# Patient Record
Sex: Female | Born: 1940
Health system: Southern US, Community
[De-identification: ages and names within clinical notes are randomized; demographics above are authoritative.]

## PROBLEM LIST (undated history)

## (undated) DIAGNOSIS — I10 Essential (primary) hypertension: Secondary | ICD-10-CM

## (undated) DIAGNOSIS — E7800 Pure hypercholesterolemia, unspecified: Secondary | ICD-10-CM

---

## 1998-01-07 ENCOUNTER — Other Ambulatory Visit: Admission: RE | Admit: 1998-01-07 | Discharge: 1998-01-07 | Payer: Self-pay | Admitting: *Deleted

## 1998-01-29 ENCOUNTER — Ambulatory Visit (HOSPITAL_COMMUNITY): Admission: RE | Admit: 1998-01-29 | Discharge: 1998-01-29 | Payer: Self-pay | Admitting: *Deleted

## 1999-01-11 ENCOUNTER — Other Ambulatory Visit: Admission: RE | Admit: 1999-01-11 | Discharge: 1999-01-11 | Payer: Self-pay | Admitting: *Deleted

## 1999-02-01 ENCOUNTER — Encounter: Payer: Self-pay | Admitting: *Deleted

## 1999-02-01 ENCOUNTER — Ambulatory Visit (HOSPITAL_COMMUNITY): Admission: RE | Admit: 1999-02-01 | Discharge: 1999-02-01 | Payer: Self-pay | Admitting: *Deleted

## 1999-12-23 ENCOUNTER — Other Ambulatory Visit: Admission: RE | Admit: 1999-12-23 | Discharge: 1999-12-23 | Payer: Self-pay | Admitting: *Deleted

## 2000-02-16 ENCOUNTER — Encounter: Payer: Self-pay | Admitting: *Deleted

## 2000-02-16 ENCOUNTER — Ambulatory Visit (HOSPITAL_COMMUNITY): Admission: RE | Admit: 2000-02-16 | Discharge: 2000-02-16 | Payer: Self-pay | Admitting: *Deleted

## 2001-02-21 ENCOUNTER — Ambulatory Visit (HOSPITAL_COMMUNITY): Admission: RE | Admit: 2001-02-21 | Discharge: 2001-02-21 | Payer: Self-pay | Admitting: *Deleted

## 2001-02-21 ENCOUNTER — Encounter: Payer: Self-pay | Admitting: *Deleted

## 2001-11-12 ENCOUNTER — Other Ambulatory Visit: Admission: RE | Admit: 2001-11-12 | Discharge: 2001-11-12 | Payer: Self-pay | Admitting: *Deleted

## 2002-11-26 ENCOUNTER — Other Ambulatory Visit: Admission: RE | Admit: 2002-11-26 | Discharge: 2002-11-26 | Payer: Self-pay | Admitting: *Deleted

## 2003-01-15 ENCOUNTER — Encounter: Payer: Self-pay | Admitting: *Deleted

## 2003-01-15 ENCOUNTER — Ambulatory Visit (HOSPITAL_COMMUNITY): Admission: RE | Admit: 2003-01-15 | Discharge: 2003-01-15 | Payer: Self-pay | Admitting: *Deleted

## 2004-01-28 ENCOUNTER — Ambulatory Visit (HOSPITAL_COMMUNITY): Admission: RE | Admit: 2004-01-28 | Discharge: 2004-01-28 | Payer: Self-pay | Admitting: Obstetrics

## 2005-02-09 ENCOUNTER — Ambulatory Visit (HOSPITAL_COMMUNITY): Admission: RE | Admit: 2005-02-09 | Discharge: 2005-02-09 | Payer: Self-pay | Admitting: Obstetrics

## 2005-02-22 ENCOUNTER — Encounter: Admission: RE | Admit: 2005-02-22 | Discharge: 2005-02-22 | Payer: Self-pay | Admitting: Obstetrics

## 2006-02-15 ENCOUNTER — Ambulatory Visit (HOSPITAL_COMMUNITY): Admission: RE | Admit: 2006-02-15 | Discharge: 2006-02-15 | Payer: Self-pay | Admitting: Obstetrics

## 2006-02-23 ENCOUNTER — Encounter: Admission: RE | Admit: 2006-02-23 | Discharge: 2006-02-23 | Payer: Self-pay | Admitting: Obstetrics

## 2006-08-16 ENCOUNTER — Encounter: Admission: RE | Admit: 2006-08-16 | Discharge: 2006-08-16 | Payer: Self-pay | Admitting: Obstetrics

## 2007-02-21 ENCOUNTER — Encounter: Admission: RE | Admit: 2007-02-21 | Discharge: 2007-02-21 | Payer: Self-pay | Admitting: Obstetrics

## 2007-09-10 IMAGING — US UNKNOWN US STUDY
1 series · 1 of 1 positions shown · non-contrast
Comparison: none

[REDACTED] LEFT
CC and MLO view(s) were taken of the left breast.

LEFT BREAST ULTRASOUND
DIGITAL LIMITED LEFT DIAGNOSTIC MAMMOGRAM AND LEFT BREAST ULTRASOUND:
CLINICAL DATA: 65-year-old returns for evaluation of the left breast.

[Series 1: unknown us study · 1 of 1 slices shown]
[im 1/1]
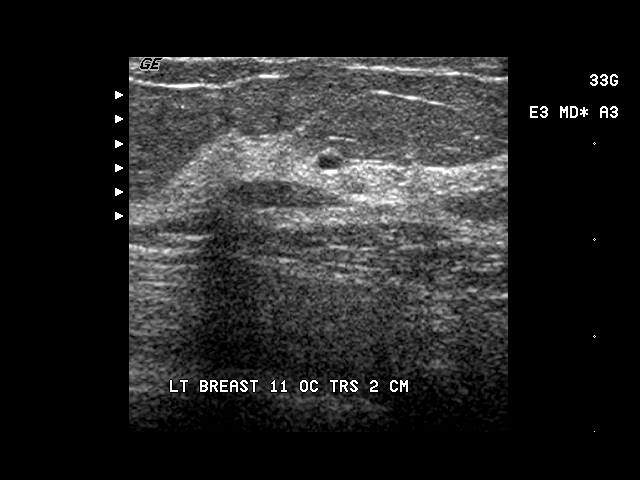

[1 of 1 positions shown; findings below may reference images not displayed]

Spot and magnified views are performed of the upper inner quadrant of the left breast.  There is a 
well-circumscribed, benign-appearing nodule in this portion of the breast.  This is associated with
rare benign-appearing calcifications.  The appearance of the nodule favors small intramammary 
lymph node.

On physical exam, I do not palpate an abnormality in the upper inner quadrant of the left breast.  
Ultrasound is performed showing normal-appearing fibroglandular parenchyma in the upper inner 
quadrant.  A small cyst is seen within the same quadrant but closer to the nipple.
IMPRESSION: Benign-appearing nodule and calcifications in the upper inner quadrant of the left breast.  I would
suggest a follow-up left mammogram in six months to assess stability.

ASSESSMENT: Probably benign - BI-RADS 3

Diagnostic mammogram of the left breast in 6 months.
,

## 2008-03-05 ENCOUNTER — Encounter: Admission: RE | Admit: 2008-03-05 | Discharge: 2008-03-05 | Payer: Self-pay | Admitting: Obstetrics

## 2009-03-11 ENCOUNTER — Encounter: Admission: RE | Admit: 2009-03-11 | Discharge: 2009-03-11 | Payer: Self-pay | Admitting: Obstetrics

## 2010-03-17 ENCOUNTER — Encounter: Admission: RE | Admit: 2010-03-17 | Discharge: 2010-03-17 | Payer: Self-pay | Admitting: Obstetrics

## 2010-06-05 ENCOUNTER — Encounter: Payer: Self-pay | Admitting: Obstetrics

## 2011-02-17 ENCOUNTER — Other Ambulatory Visit: Payer: Self-pay | Admitting: Obstetrics

## 2011-02-17 DIAGNOSIS — Z1231 Encounter for screening mammogram for malignant neoplasm of breast: Secondary | ICD-10-CM

## 2011-03-30 ENCOUNTER — Ambulatory Visit: Payer: Self-pay

## 2011-04-13 ENCOUNTER — Ambulatory Visit
Admission: RE | Admit: 2011-04-13 | Discharge: 2011-04-13 | Disposition: A | Discharge status: Home / Self Care | Payer: Medicare Other | Source: Ambulatory Visit | Attending: Obstetrics | Admitting: Obstetrics

## 2011-04-13 DIAGNOSIS — Z1231 Encounter for screening mammogram for malignant neoplasm of breast: Secondary | ICD-10-CM

## 2012-03-12 ENCOUNTER — Other Ambulatory Visit: Payer: Self-pay | Admitting: Obstetrics

## 2012-03-12 DIAGNOSIS — Z1231 Encounter for screening mammogram for malignant neoplasm of breast: Secondary | ICD-10-CM

## 2012-04-25 ENCOUNTER — Ambulatory Visit
Admission: RE | Admit: 2012-04-25 | Discharge: 2012-04-25 | Disposition: A | Discharge status: Home / Self Care | Payer: Medicare Other | Source: Ambulatory Visit | Attending: Obstetrics | Admitting: Obstetrics

## 2012-04-25 DIAGNOSIS — Z1231 Encounter for screening mammogram for malignant neoplasm of breast: Secondary | ICD-10-CM

## 2012-11-29 ENCOUNTER — Ambulatory Visit
Admission: RE | Admit: 2012-11-29 | Discharge: 2012-11-29 | Disposition: A | Discharge status: Home / Self Care | Payer: Medicare Other | Source: Ambulatory Visit | Attending: Family Medicine | Admitting: Family Medicine

## 2012-11-29 ENCOUNTER — Other Ambulatory Visit: Payer: Self-pay | Admitting: Family Medicine

## 2012-11-29 DIAGNOSIS — M79641 Pain in right hand: Secondary | ICD-10-CM

## 2013-04-16 ENCOUNTER — Other Ambulatory Visit: Payer: Self-pay

## 2013-04-16 DIAGNOSIS — Z1231 Encounter for screening mammogram for malignant neoplasm of breast: Secondary | ICD-10-CM

## 2013-05-22 ENCOUNTER — Ambulatory Visit
Admission: RE | Admit: 2013-05-22 | Discharge: 2013-05-22 | Disposition: A | Discharge status: Home / Self Care | Payer: Medicare Other | Source: Ambulatory Visit

## 2013-05-22 DIAGNOSIS — Z1231 Encounter for screening mammogram for malignant neoplasm of breast: Secondary | ICD-10-CM

## 2013-11-11 IMAGING — MG MM DIGITAL SCREENING BILAT
4 series · 4 of 4 positions shown · non-contrast
Comparison: Previous exams.

CLINICAL DATA: Screening.

DIGITAL BILATERAL SCREENING MAMMOGRAM WITH CAD

[R CC]
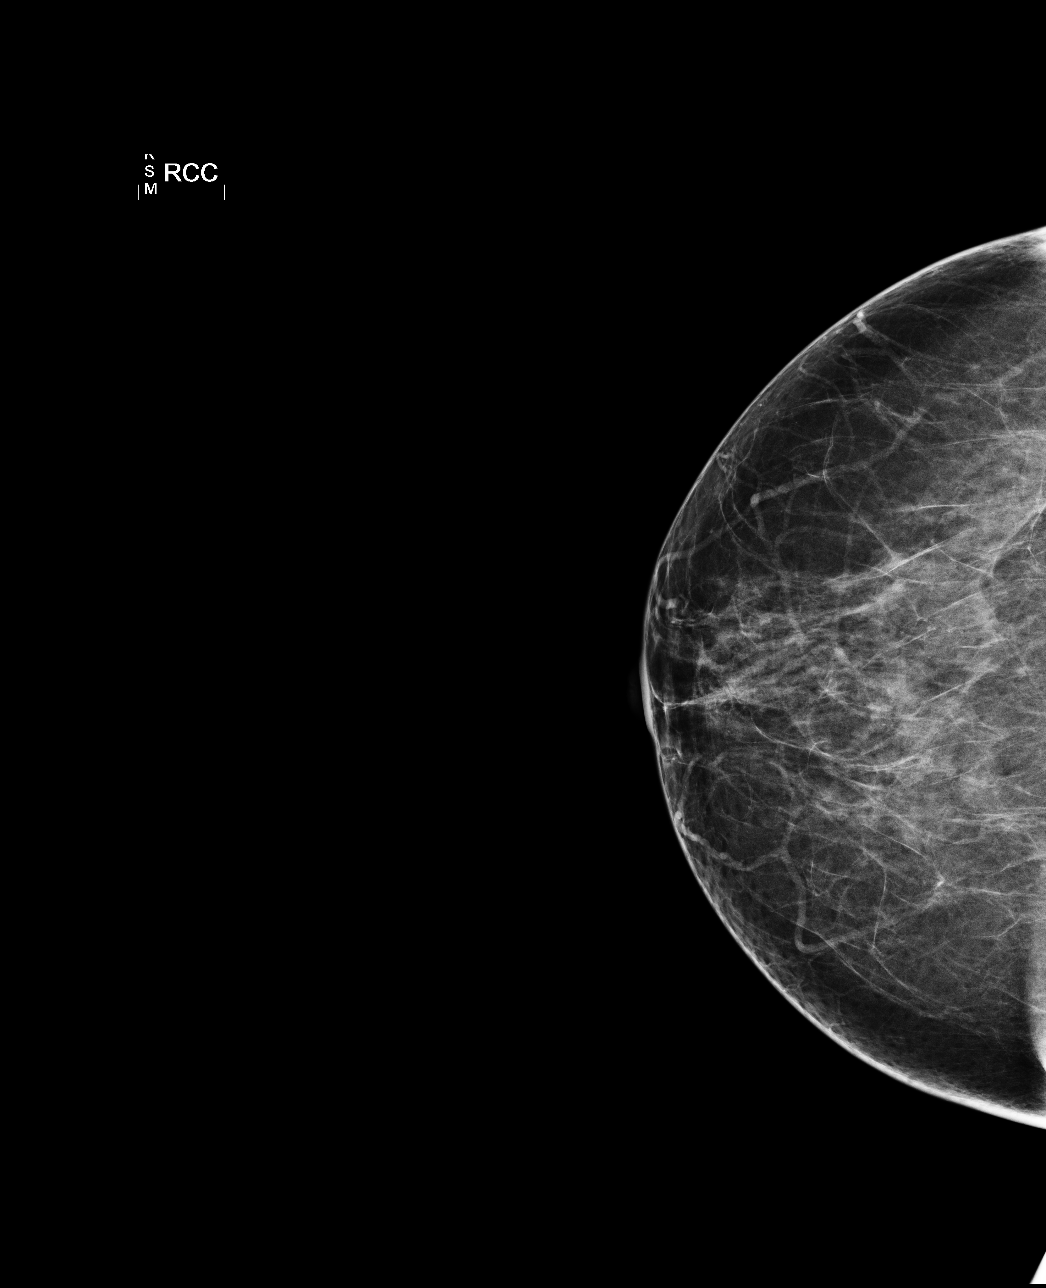

[L CC]
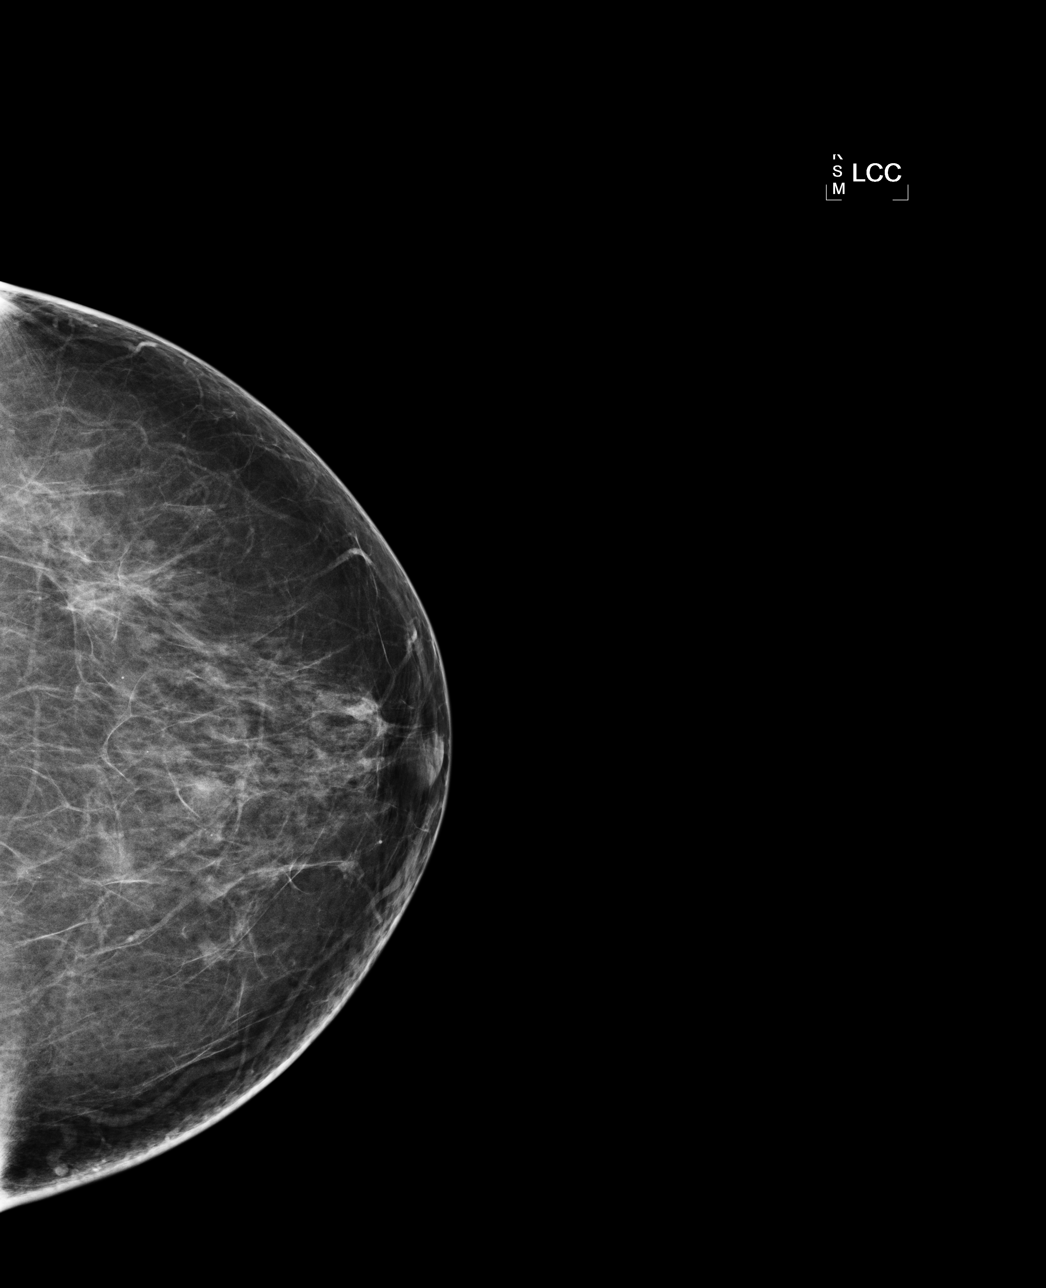

[L MLO]
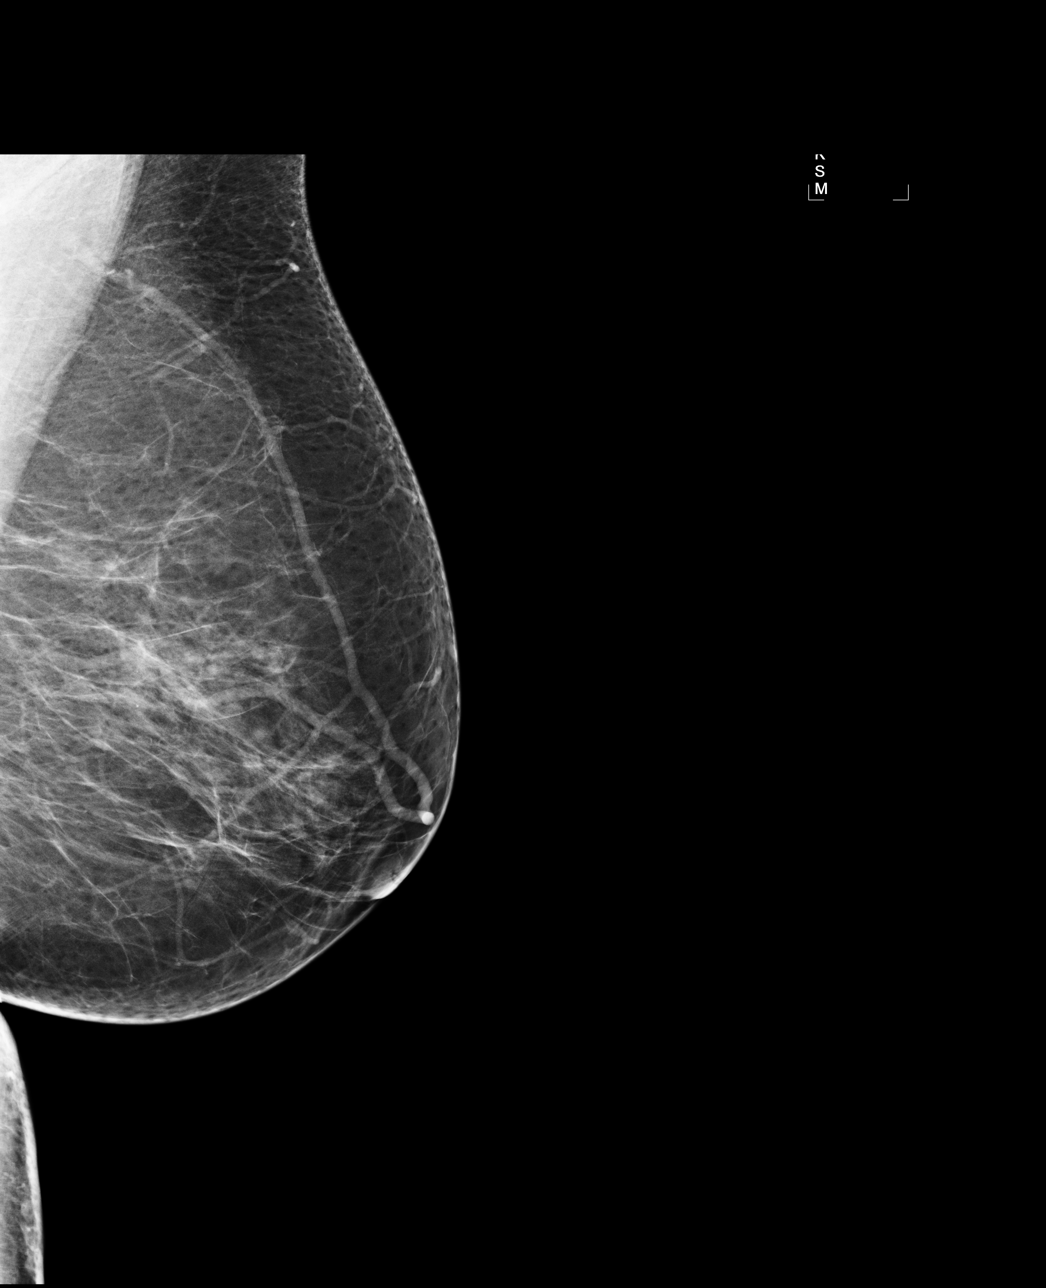

[R MLO]
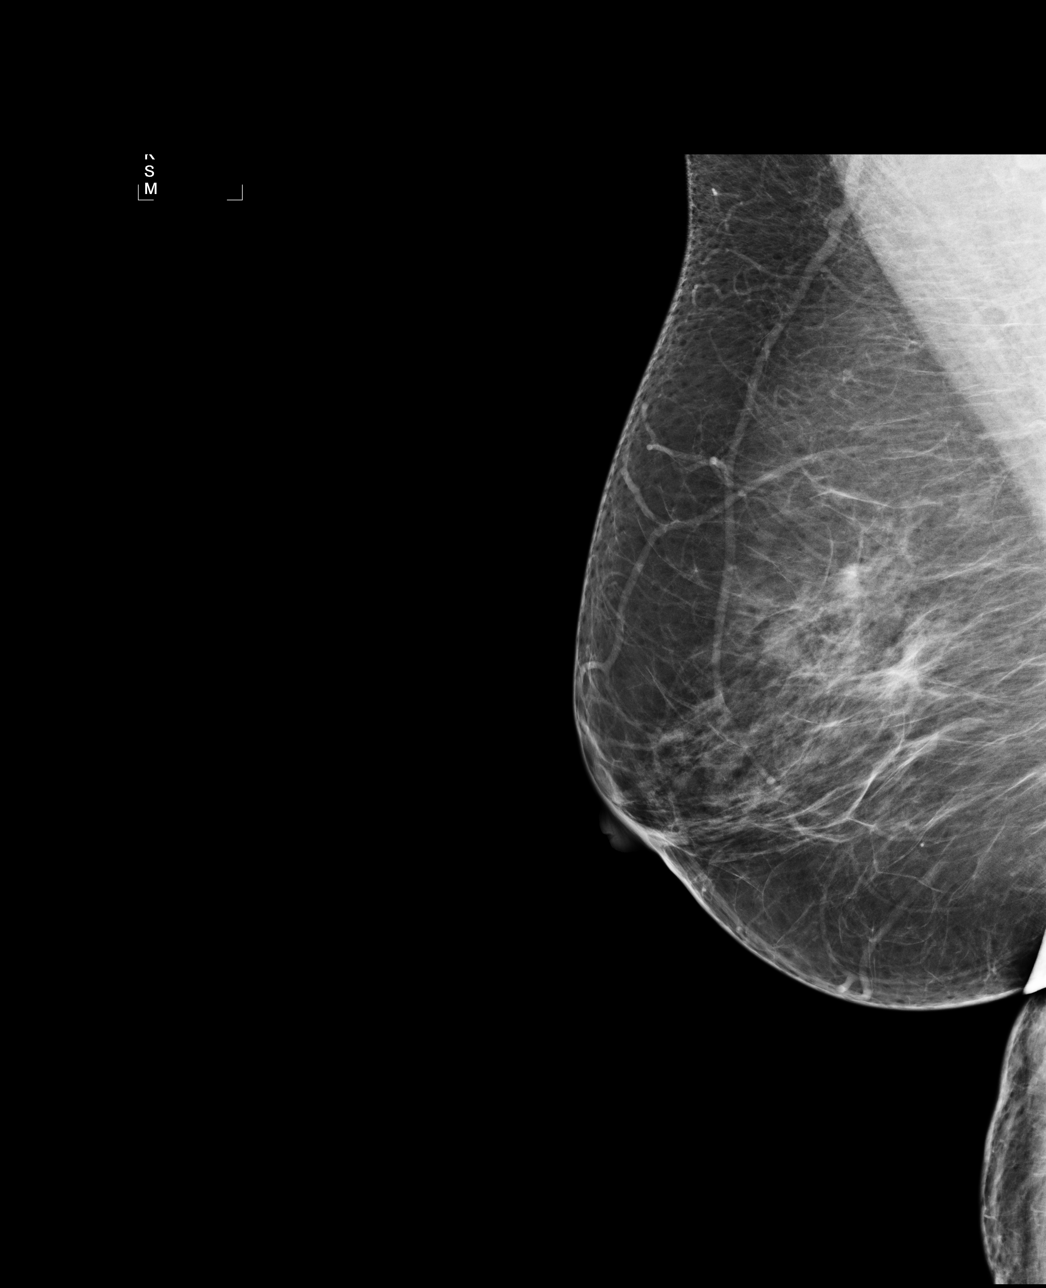

[4 of 4 positions shown; findings below may reference images not displayed]

FINDINGS: ACR Breast Density Category 2: There is a scattered fibroglandular
pattern.

No suspicious masses, architectural distortion, or calcifications
are present.

Images were processed with CAD.
IMPRESSION: No mammographic evidence of malignancy.

A result letter of this screening mammogram will be mailed directly
to the patient.

RECOMMENDATION:
Screening mammogram in one year. (Code:[id number])

BI-RADS CATEGORY 1:  Negative.

## 2014-05-01 ENCOUNTER — Other Ambulatory Visit: Payer: Self-pay

## 2014-05-01 DIAGNOSIS — Z1231 Encounter for screening mammogram for malignant neoplasm of breast: Secondary | ICD-10-CM

## 2014-06-04 ENCOUNTER — Ambulatory Visit: Payer: Medicare Other

## 2014-06-11 ENCOUNTER — Ambulatory Visit: Payer: Medicare Other

## 2014-06-18 ENCOUNTER — Ambulatory Visit
Admission: RE | Admit: 2014-06-18 | Discharge: 2014-06-18 | Disposition: A | Discharge status: Home / Self Care | Payer: Self-pay | Source: Ambulatory Visit

## 2014-06-18 DIAGNOSIS — Z1231 Encounter for screening mammogram for malignant neoplasm of breast: Secondary | ICD-10-CM

## 2015-05-26 ENCOUNTER — Other Ambulatory Visit: Payer: Self-pay

## 2015-05-26 DIAGNOSIS — Z1231 Encounter for screening mammogram for malignant neoplasm of breast: Secondary | ICD-10-CM

## 2015-06-24 ENCOUNTER — Ambulatory Visit
Admission: RE | Admit: 2015-06-24 | Discharge: 2015-06-24 | Disposition: A | Discharge status: Home / Self Care | Payer: Medicare Other | Source: Ambulatory Visit

## 2015-06-24 DIAGNOSIS — Z1231 Encounter for screening mammogram for malignant neoplasm of breast: Secondary | ICD-10-CM

## 2016-05-25 ENCOUNTER — Other Ambulatory Visit: Payer: Self-pay | Admitting: Family Medicine

## 2016-05-25 DIAGNOSIS — Z1231 Encounter for screening mammogram for malignant neoplasm of breast: Secondary | ICD-10-CM

## 2016-07-13 ENCOUNTER — Ambulatory Visit
Admission: RE | Admit: 2016-07-13 | Discharge: 2016-07-13 | Disposition: A | Discharge status: Home / Self Care | Payer: Medicare Other | Source: Ambulatory Visit | Attending: Family Medicine | Admitting: Family Medicine

## 2016-07-13 DIAGNOSIS — Z1231 Encounter for screening mammogram for malignant neoplasm of breast: Secondary | ICD-10-CM

## 2017-06-07 ENCOUNTER — Other Ambulatory Visit: Payer: Self-pay | Admitting: Family Medicine

## 2017-06-07 DIAGNOSIS — Z1231 Encounter for screening mammogram for malignant neoplasm of breast: Secondary | ICD-10-CM

## 2017-07-19 ENCOUNTER — Ambulatory Visit
Admission: RE | Admit: 2017-07-19 | Discharge: 2017-07-19 | Disposition: A | Discharge status: Home / Self Care | Payer: Medicare Other | Source: Ambulatory Visit | Attending: Family Medicine | Admitting: Family Medicine

## 2017-07-19 DIAGNOSIS — Z1231 Encounter for screening mammogram for malignant neoplasm of breast: Secondary | ICD-10-CM

## 2018-03-20 ENCOUNTER — Other Ambulatory Visit: Payer: Self-pay | Admitting: Family Medicine

## 2018-03-20 DIAGNOSIS — M8580 Other specified disorders of bone density and structure, unspecified site: Secondary | ICD-10-CM

## 2018-05-07 ENCOUNTER — Other Ambulatory Visit: Payer: Medicare Other

## 2018-06-11 ENCOUNTER — Other Ambulatory Visit: Payer: Self-pay | Admitting: Family Medicine

## 2018-06-11 ENCOUNTER — Ambulatory Visit
Admission: RE | Admit: 2018-06-11 | Discharge: 2018-06-11 | Disposition: A | Discharge status: Home / Self Care | Payer: Medicare Other | Source: Ambulatory Visit | Attending: Family Medicine | Admitting: Family Medicine

## 2018-06-11 DIAGNOSIS — Z1231 Encounter for screening mammogram for malignant neoplasm of breast: Secondary | ICD-10-CM

## 2018-06-11 DIAGNOSIS — M8580 Other specified disorders of bone density and structure, unspecified site: Secondary | ICD-10-CM

## 2018-08-06 ENCOUNTER — Ambulatory Visit: Payer: Medicare Other

## 2018-10-03 ENCOUNTER — Ambulatory Visit: Payer: Medicare Other

## 2018-11-06 ENCOUNTER — Ambulatory Visit: Payer: Medicare Other

## 2019-05-21 ENCOUNTER — Ambulatory Visit
Admission: RE | Admit: 2019-05-21 | Discharge: 2019-05-21 | Disposition: A | Discharge status: Home / Self Care | Payer: Medicare Other | Source: Ambulatory Visit | Attending: Family Medicine | Admitting: Family Medicine

## 2019-05-21 ENCOUNTER — Other Ambulatory Visit: Payer: Self-pay

## 2019-05-21 DIAGNOSIS — Z1231 Encounter for screening mammogram for malignant neoplasm of breast: Secondary | ICD-10-CM

## 2019-05-22 ENCOUNTER — Other Ambulatory Visit: Payer: Self-pay | Admitting: Family Medicine

## 2019-05-22 DIAGNOSIS — R928 Other abnormal and inconclusive findings on diagnostic imaging of breast: Secondary | ICD-10-CM

## 2019-05-27 ENCOUNTER — Ambulatory Visit
Admission: RE | Admit: 2019-05-27 | Discharge: 2019-05-27 | Disposition: A | Discharge status: Home / Self Care | Payer: Medicare PPO | Source: Ambulatory Visit | Attending: Family Medicine | Admitting: Family Medicine

## 2019-05-27 ENCOUNTER — Other Ambulatory Visit: Payer: Self-pay

## 2019-05-27 ENCOUNTER — Other Ambulatory Visit: Payer: Self-pay | Admitting: Family Medicine

## 2019-05-27 DIAGNOSIS — R928 Other abnormal and inconclusive findings on diagnostic imaging of breast: Secondary | ICD-10-CM

## 2019-05-27 DIAGNOSIS — N6489 Other specified disorders of breast: Secondary | ICD-10-CM

## 2019-05-28 ENCOUNTER — Other Ambulatory Visit: Payer: Self-pay | Admitting: Family Medicine

## 2019-05-28 ENCOUNTER — Ambulatory Visit
Admission: RE | Admit: 2019-05-28 | Discharge: 2019-05-28 | Disposition: A | Discharge status: Home / Self Care | Payer: Medicare PPO | Source: Ambulatory Visit | Attending: Family Medicine | Admitting: Family Medicine

## 2019-05-28 DIAGNOSIS — N6489 Other specified disorders of breast: Secondary | ICD-10-CM

## 2019-05-31 ENCOUNTER — Ambulatory Visit
Admission: RE | Admit: 2019-05-31 | Discharge: 2019-05-31 | Disposition: A | Discharge status: Home / Self Care | Payer: Medicare PPO | Source: Ambulatory Visit | Attending: Family Medicine | Admitting: Family Medicine

## 2019-05-31 ENCOUNTER — Other Ambulatory Visit: Payer: Self-pay

## 2019-05-31 ENCOUNTER — Other Ambulatory Visit: Payer: Medicare PPO

## 2019-05-31 DIAGNOSIS — N6489 Other specified disorders of breast: Secondary | ICD-10-CM

## 2019-12-02 DIAGNOSIS — Z818 Family history of other mental and behavioral disorders: Secondary | ICD-10-CM | POA: Diagnosis not present

## 2019-12-02 DIAGNOSIS — Z882 Allergy status to sulfonamides status: Secondary | ICD-10-CM | POA: Diagnosis not present

## 2019-12-02 DIAGNOSIS — Z823 Family history of stroke: Secondary | ICD-10-CM | POA: Diagnosis not present

## 2019-12-02 DIAGNOSIS — J309 Allergic rhinitis, unspecified: Secondary | ICD-10-CM | POA: Diagnosis not present

## 2019-12-02 DIAGNOSIS — E669 Obesity, unspecified: Secondary | ICD-10-CM | POA: Diagnosis not present

## 2019-12-02 DIAGNOSIS — H04129 Dry eye syndrome of unspecified lacrimal gland: Secondary | ICD-10-CM | POA: Diagnosis not present

## 2019-12-02 DIAGNOSIS — R030 Elevated blood-pressure reading, without diagnosis of hypertension: Secondary | ICD-10-CM | POA: Diagnosis not present

## 2019-12-02 DIAGNOSIS — Z7982 Long term (current) use of aspirin: Secondary | ICD-10-CM | POA: Diagnosis not present

## 2019-12-02 DIAGNOSIS — R42 Dizziness and giddiness: Secondary | ICD-10-CM | POA: Diagnosis not present

## 2019-12-02 DIAGNOSIS — H547 Unspecified visual loss: Secondary | ICD-10-CM | POA: Diagnosis not present

## 2020-03-25 DIAGNOSIS — T161XXA Foreign body in right ear, initial encounter: Secondary | ICD-10-CM | POA: Diagnosis not present

## 2020-03-25 DIAGNOSIS — Z23 Encounter for immunization: Secondary | ICD-10-CM | POA: Diagnosis not present

## 2020-03-25 DIAGNOSIS — H6121 Impacted cerumen, right ear: Secondary | ICD-10-CM | POA: Diagnosis not present

## 2020-03-25 DIAGNOSIS — Z0000 Encounter for general adult medical examination without abnormal findings: Secondary | ICD-10-CM | POA: Diagnosis not present

## 2020-03-25 DIAGNOSIS — I10 Essential (primary) hypertension: Secondary | ICD-10-CM | POA: Diagnosis not present

## 2020-03-25 DIAGNOSIS — E785 Hyperlipidemia, unspecified: Secondary | ICD-10-CM | POA: Diagnosis not present

## 2020-03-25 DIAGNOSIS — M25561 Pain in right knee: Secondary | ICD-10-CM | POA: Diagnosis not present

## 2020-03-25 DIAGNOSIS — G5603 Carpal tunnel syndrome, bilateral upper limbs: Secondary | ICD-10-CM | POA: Diagnosis not present

## 2020-03-25 DIAGNOSIS — F411 Generalized anxiety disorder: Secondary | ICD-10-CM | POA: Diagnosis not present

## 2020-03-25 DIAGNOSIS — Z1159 Encounter for screening for other viral diseases: Secondary | ICD-10-CM | POA: Diagnosis not present

## 2020-03-26 ENCOUNTER — Other Ambulatory Visit: Payer: Self-pay | Admitting: Family Medicine

## 2020-03-26 ENCOUNTER — Ambulatory Visit
Admission: RE | Admit: 2020-03-26 | Discharge: 2020-03-26 | Disposition: A | Discharge status: Home / Self Care | Payer: Medicare PPO | Source: Ambulatory Visit | Attending: Family Medicine | Admitting: Family Medicine

## 2020-03-26 DIAGNOSIS — M25561 Pain in right knee: Secondary | ICD-10-CM

## 2020-05-04 DIAGNOSIS — Z03818 Encounter for observation for suspected exposure to other biological agents ruled out: Secondary | ICD-10-CM | POA: Diagnosis not present

## 2020-09-22 DIAGNOSIS — I10 Essential (primary) hypertension: Secondary | ICD-10-CM | POA: Diagnosis not present

## 2020-09-22 DIAGNOSIS — D649 Anemia, unspecified: Secondary | ICD-10-CM | POA: Diagnosis not present

## 2020-09-22 DIAGNOSIS — E785 Hyperlipidemia, unspecified: Secondary | ICD-10-CM | POA: Diagnosis not present

## 2020-09-22 DIAGNOSIS — G5603 Carpal tunnel syndrome, bilateral upper limbs: Secondary | ICD-10-CM | POA: Diagnosis not present

## 2020-09-22 DIAGNOSIS — M65342 Trigger finger, left ring finger: Secondary | ICD-10-CM | POA: Diagnosis not present

## 2020-09-22 DIAGNOSIS — F411 Generalized anxiety disorder: Secondary | ICD-10-CM | POA: Diagnosis not present

## 2020-09-22 DIAGNOSIS — J309 Allergic rhinitis, unspecified: Secondary | ICD-10-CM | POA: Diagnosis not present

## 2020-11-28 ENCOUNTER — Other Ambulatory Visit: Payer: Self-pay

## 2020-11-28 ENCOUNTER — Ambulatory Visit
Admission: EM | Admit: 2020-11-28 | Discharge: 2020-11-28 | Disposition: A | Discharge status: Home / Self Care | Payer: Medicare PPO

## 2020-11-28 ENCOUNTER — Encounter: Payer: Self-pay | Admitting: Emergency Medicine

## 2020-11-28 DIAGNOSIS — R059 Cough, unspecified: Secondary | ICD-10-CM

## 2020-11-28 DIAGNOSIS — U071 COVID-19: Secondary | ICD-10-CM | POA: Diagnosis not present

## 2020-11-28 HISTORY — DX: Pure hypercholesterolemia, unspecified: E78.00

## 2020-11-28 HISTORY — DX: Essential (primary) hypertension: I10

## 2020-11-28 MED ORDER — PROMETHAZINE-DM 6.25-15 MG/5ML PO SYRP: 5.0000 mL | ORAL_SOLUTION | Freq: Three times a day (TID) | ORAL | 0 refills | Status: DC | PRN

## 2020-11-28 NOTE — ED Triage Notes (Signed)
Patient c/o productive cough w/ "lumpy" sputum x 2-3 days.   Patient denies fever at home. Patient denies SOB.   Patient reports a POSITIVE at home COVID test at home today.   Patient has used tylenol and "throat spray" with no relief of symptoms.

## 2020-11-28 NOTE — Discharge Instructions (Addendum)
Based on your symptom and positive test your quarantine will end on 11/30/20. Our office will notify you once prescription has been sent to the pharmacy for Paxlovid antiviral against covid. 

## 2020-11-28 NOTE — ED Provider Notes (Signed)
[facility]    CSN: [id number] Arrival date & time: 11/28/20  1116      History   Chief Complaint Chief Complaint  Patient presents with   Cough    HPI Kristy Goodwin is a 80 y.o. female.   HPI  Patient with COVID symptoms of productive cough, sore throat, and congestion presents today for evaluation after testing positive for COVID today at home. Symptoms present x 2-3 days. Patient test negative for COVID 5 days ago prior to attending a family gathering. She is afebrile. Denies SOB or chest pain. Past Medical History:  Diagnosis Date   Hypercholesteremia    Hypertension     There are no problems to display for this patient.   History reviewed. No pertinent surgical history.  OB History   No obstetric history on file.      Home Medications    Prior to Admission medications   Medication Sig Start Date End Date Taking? Authorizing Provider  promethazine-dextromethorphan (PROMETHAZINE-DM) 6.25-15 MG/5ML syrup Take 5 mLs by mouth 3 (three) times daily as needed for cough. 11/28/20  Yes Bing Neighbors, FNP  amLODipine (NORVASC) 2.5 MG tablet Take 2.5 mg by mouth daily. 10/11/20   [provider]  atorvastatin (LIPITOR) 20 MG tablet Take 20 mg by mouth daily. 09/22/20   [provider]  ferrous sulfate 325 (65 FE) MG tablet Take 325 mg by mouth daily. 09/24/20   [provider]  triamterene-hydrochlorothiazide (MAXZIDE-25) 37.5-25 MG tablet Take 1 tablet by mouth daily. 11/28/15   [provider]    Family History History reviewed. No pertinent family history.  Social History Social History   Tobacco Use   Smoking status: Unknown     Allergies   Sulfamethoxazole   Review of Systems Review of Systems Pertinent negatives listed in HPI  Physical Exam Triage Vital Signs ED Triage Vitals  Enc Vitals Group     BP 11/28/20 1325 (!) 143/85     Pulse Rate 11/28/20 1325 99     Resp 11/28/20 1325 18     Temp  11/28/20 1325 98.3 F (36.8 C)     Temp Source 11/28/20 1325 Oral     SpO2 11/28/20 1325 95 %     Weight --      Height --      Head Circumference --      Peak Flow --      Pain Score 11/28/20 1322 0     Pain Loc --      Pain Edu? --      Excl. in [place]? --    No data found.  Updated Vital Signs BP (!) 143/85 (BP Location: Right Arm)   Pulse 99   Temp 98.3 F (36.8 C) (Oral)   Resp 18   SpO2 95%   Visual Acuity Right Eye Distance:   Left Eye Distance:   Bilateral Distance:    Right Eye Near:   Left Eye Near:    Bilateral Near:     Physical Exam  General Appearance:    Alert, cooperative, non acutely ill appearing, no distress  HENT:   Normocephalic, ears normal, nares mucosal edema with congestion, rhinorrhea, oropharynx    Eyes:    PERRL, conjunctiva/corneas clear, EOM's intact       Lungs:     Clear to auscultation bilaterally, respirations unlabored  Heart:    Regular rate and rhythm  Neurologic:   Awake, alert, oriented x 3. No apparent focal  neurological deficit     UC Treatments / Results  Labs (all labs ordered are listed, but only abnormal results are displayed) Labs Reviewed  BASIC METABOLIC PANEL   Narrative:    Performed at:  [address] [place] [address], [place], [place]  [id number] Lab Director: Jolene Schimke MD, Phone:  [phone]    EKG   Radiology No results found.  Procedures Procedures (including critical care time)  Medications Ordered in UC Medications - No data to display  Initial Impression / Assessment and Plan / UC Course  I have reviewed the triage vital signs and the nursing notes.  Pertinent labs & imaging results that were available during my care of the patient were reviewed by me and considered in my medical decision making (see chart for details).    COVID-19 viral illness education and advice provided.  BMP pending, pt eligible for Paxlovid. ER precautions given. Symptom management per discharge  instructions.  Final Clinical Impressions(s) / UC Diagnoses   Final diagnoses:  COVID-19  Cough     Discharge Instructions      Based on your symptom and positive test your quarantine will end on 11/30/20. Our office will notify you once prescription has been sent to the pharmacy for Paxlovid antiviral against covid.     ED Prescriptions     Medication Sig Dispense Auth. Provider   promethazine-dextromethorphan (PROMETHAZINE-DM) 6.25-15 MG/5ML syrup Take 5 mLs by mouth 3 (three) times daily as needed for cough. 118 mL Bing Neighbors, FNP      PDMP not reviewed this encounter.   Bing Neighbors, [place] 11/30/20 [phone]

## 2020-11-30 ENCOUNTER — Telehealth: Payer: Self-pay | Admitting: Family Medicine

## 2020-11-30 LAB — BASIC METABOLIC PANEL
BUN/Creatinine Ratio: 16 (ref 12–28)
BUN: 14 mg/dL (ref 8–27)
CO2: 23 mmol/L (ref 20–29)
Calcium: 9.7 mg/dL (ref 8.7–10.3)
Chloride: 105 mmol/L (ref 96–106)
Creatinine, Ser: 0.86 mg/dL (ref 0.57–1.00)
Glucose: 82 mg/dL (ref 65–99)
Potassium: 3.9 mmol/L (ref 3.5–5.2)
Sodium: 141 mmol/L (ref 134–144)
eGFR: 69 mL/min/{1.73_m2} (ref >59)

## 2020-11-30 MED ORDER — NIRMATRELVIR/RITONAVIR (PAXLOVID)TABLET: 3.0000 | ORAL_TABLET | Freq: Two times a day (BID) | ORAL | 0 refills | Status: AC

## 2020-11-30 NOTE — Telephone Encounter (Signed)
Paxlovid sent to pharmacy on file.

## 2020-12-29 DIAGNOSIS — D509 Iron deficiency anemia, unspecified: Secondary | ICD-10-CM | POA: Diagnosis not present

## 2021-02-09 DIAGNOSIS — E785 Hyperlipidemia, unspecified: Secondary | ICD-10-CM | POA: Diagnosis not present

## 2021-02-09 DIAGNOSIS — I10 Essential (primary) hypertension: Secondary | ICD-10-CM | POA: Diagnosis not present

## 2021-02-09 DIAGNOSIS — H04129 Dry eye syndrome of unspecified lacrimal gland: Secondary | ICD-10-CM | POA: Diagnosis not present

## 2021-02-09 DIAGNOSIS — F411 Generalized anxiety disorder: Secondary | ICD-10-CM | POA: Diagnosis not present

## 2021-02-09 DIAGNOSIS — E611 Iron deficiency: Secondary | ICD-10-CM | POA: Diagnosis not present

## 2021-02-09 DIAGNOSIS — E669 Obesity, unspecified: Secondary | ICD-10-CM | POA: Diagnosis not present

## 2021-02-09 DIAGNOSIS — R42 Dizziness and giddiness: Secondary | ICD-10-CM | POA: Diagnosis not present

## 2021-02-09 DIAGNOSIS — Z6831 Body mass index (BMI) 31.0-31.9, adult: Secondary | ICD-10-CM | POA: Diagnosis not present

## 2021-03-24 DIAGNOSIS — Z23 Encounter for immunization: Secondary | ICD-10-CM | POA: Diagnosis not present

## 2021-04-06 DIAGNOSIS — J309 Allergic rhinitis, unspecified: Secondary | ICD-10-CM | POA: Diagnosis not present

## 2021-04-06 DIAGNOSIS — I10 Essential (primary) hypertension: Secondary | ICD-10-CM | POA: Diagnosis not present

## 2021-04-06 DIAGNOSIS — M79642 Pain in left hand: Secondary | ICD-10-CM | POA: Diagnosis not present

## 2021-04-06 DIAGNOSIS — F411 Generalized anxiety disorder: Secondary | ICD-10-CM | POA: Diagnosis not present

## 2021-04-06 DIAGNOSIS — E785 Hyperlipidemia, unspecified: Secondary | ICD-10-CM | POA: Diagnosis not present

## 2021-04-06 DIAGNOSIS — D509 Iron deficiency anemia, unspecified: Secondary | ICD-10-CM | POA: Diagnosis not present

## 2021-04-06 DIAGNOSIS — Z0000 Encounter for general adult medical examination without abnormal findings: Secondary | ICD-10-CM | POA: Diagnosis not present

## 2021-04-06 DIAGNOSIS — H9311 Tinnitus, right ear: Secondary | ICD-10-CM | POA: Diagnosis not present

## 2021-04-06 DIAGNOSIS — R202 Paresthesia of skin: Secondary | ICD-10-CM | POA: Diagnosis not present

## 2021-04-16 DIAGNOSIS — H903 Sensorineural hearing loss, bilateral: Secondary | ICD-10-CM | POA: Diagnosis not present

## 2021-04-16 DIAGNOSIS — J301 Allergic rhinitis due to pollen: Secondary | ICD-10-CM | POA: Diagnosis not present

## 2021-04-16 DIAGNOSIS — H9313 Tinnitus, bilateral: Secondary | ICD-10-CM | POA: Diagnosis not present

## 2021-11-11 DIAGNOSIS — F411 Generalized anxiety disorder: Secondary | ICD-10-CM | POA: Diagnosis not present

## 2021-11-11 DIAGNOSIS — M25561 Pain in right knee: Secondary | ICD-10-CM | POA: Diagnosis not present

## 2021-11-11 DIAGNOSIS — J309 Allergic rhinitis, unspecified: Secondary | ICD-10-CM | POA: Diagnosis not present

## 2021-11-11 DIAGNOSIS — R21 Rash and other nonspecific skin eruption: Secondary | ICD-10-CM | POA: Diagnosis not present

## 2021-11-11 DIAGNOSIS — D509 Iron deficiency anemia, unspecified: Secondary | ICD-10-CM | POA: Diagnosis not present

## 2021-11-11 DIAGNOSIS — E785 Hyperlipidemia, unspecified: Secondary | ICD-10-CM | POA: Diagnosis not present

## 2021-11-11 DIAGNOSIS — I10 Essential (primary) hypertension: Secondary | ICD-10-CM | POA: Diagnosis not present

## 2022-01-06 ENCOUNTER — Ambulatory Visit
Admission: EM | Admit: 2022-01-06 | Discharge: 2022-01-06 | Disposition: A | Discharge status: Home / Self Care | Payer: Medicare PPO | Attending: Family Medicine | Admitting: Family Medicine

## 2022-01-06 DIAGNOSIS — H903 Sensorineural hearing loss, bilateral: Secondary | ICD-10-CM | POA: Diagnosis not present

## 2022-01-06 DIAGNOSIS — H9313 Tinnitus, bilateral: Secondary | ICD-10-CM | POA: Diagnosis not present

## 2022-01-06 DIAGNOSIS — U071 COVID-19: Secondary | ICD-10-CM

## 2022-01-06 MED ORDER — NIRMATRELVIR/RITONAVIR (PAXLOVID)TABLET: ORAL_TABLET | ORAL | 0 refills | Status: AC

## 2022-01-06 NOTE — ED Triage Notes (Signed)
Pt presents covid positive as of this morning, she only has a slight cough.

## 2022-01-06 NOTE — Discharge Instructions (Addendum)
Take Paxlovid according to the package instructions

## 2022-01-06 NOTE — ED Provider Notes (Signed)
[facility]    CSN: [id number] Arrival date & time: 01/06/22  1628      History   Chief Complaint Chief Complaint  Patient presents with   Covid Positive    HPI Kristy Goodwin is a 81 y.o. female.   HPI Here for cough that began today.  Her husband began coughing and having upper respiratory symptoms on August 22.  He tested positive for COVID then and has been on Paxlovid.  The patient was negative on August 22, but she retested today when she began coughing and she was COVID-positive.  No fever or dyspnea or vomiting.  Past Medical History:  Diagnosis Date   Hypercholesteremia    Hypertension     There are no problems to display for this patient.   History reviewed. No pertinent surgical history.  OB History   No obstetric history on file.      Home Medications    Prior to Admission medications   Medication Sig Start Date End Date Taking? Authorizing Provider  nirmatrelvir/ritonavir EUA (PAXLOVID) 20 x 150 MG & 10 x 100MG TABS Patient GFR is 69. Take nirmatrelvir (150 mg) two tablets twice daily for 5 days and ritonavir (100 mg) one tablet twice daily for 5 days. 01/06/22  Yes Barrett Henle, MD  amLODipine (NORVASC) 2.5 MG tablet Take 2.5 mg by mouth daily. 10/11/20   [provider]  atorvastatin (LIPITOR) 20 MG tablet Take 20 mg by mouth daily. 09/22/20   [provider]  ferrous sulfate 325 (65 FE) MG tablet Take 325 mg by mouth daily. 09/24/20   [provider]  triamterene-hydrochlorothiazide (MAXZIDE-25) 37.5-25 MG tablet Take 1 tablet by mouth daily. 11/28/15   [provider]    Family History Family History  Family history unknown: Yes    Social History Social History   Tobacco Use   Smoking status: Unknown     Allergies   Sulfamethoxazole   Review of Systems Review of Systems   Physical Exam Triage Vital Signs ED Triage Vitals  Enc Vitals Group     BP 01/06/22 1703 134/74      Pulse Rate 01/06/22 1703 (!) 102     Resp 01/06/22 1703 17     Temp 01/06/22 1703 98.2 F (36.8 C)     Temp Source 01/06/22 1703 Oral     SpO2 01/06/22 1703 95 %     Weight --      Height --      Head Circumference --      Peak Flow --      Pain Score 01/06/22 1705 0     Pain Loc --      Pain Edu? --      Excl. in [place]? --    No data found.  Updated Vital Signs BP 134/74 (BP Location: Left Arm)   Pulse (!) 102   Temp 98.2 F (36.8 C) (Oral)   Resp 17   SpO2 95%   Visual Acuity Right Eye Distance:   Left Eye Distance:   Bilateral Distance:    Right Eye Near:   Left Eye Near:    Bilateral Near:     Physical Exam Vitals reviewed.  Constitutional:      General: She is not in acute distress.    Appearance: She is not ill-appearing, toxic-appearing or diaphoretic.  HENT:     Nose: Nose normal.     Mouth/Throat:     Mouth: Mucous membranes are moist.  Pharynx: No oropharyngeal exudate or posterior oropharyngeal erythema.  Eyes:     Extraocular Movements: Extraocular movements intact.     Conjunctiva/sclera: Conjunctivae normal.     Pupils: Pupils are equal, round, and reactive to light.  Cardiovascular:     Rate and Rhythm: Normal rate and regular rhythm.     Heart sounds: No murmur heard. Pulmonary:     Effort: Pulmonary effort is normal.     Breath sounds: Normal breath sounds. No stridor. No wheezing, rhonchi or rales.  Musculoskeletal:     Cervical back: Neck supple.  Lymphadenopathy:     Cervical: No cervical adenopathy.  Skin:    Coloration: Skin is not jaundiced or pale.  Neurological:     General: No focal deficit present.     Mental Status: She is alert and oriented to person, place, and time.  Psychiatric:        Behavior: Behavior normal.      UC Treatments / Results  Labs (all labs ordered are listed, but only abnormal results are displayed) Labs Reviewed - No data to display  EKG   Radiology No results  found.  Procedures Procedures (including critical care time)  Medications Ordered in UC Medications - No data to display  Initial Impression / Assessment and Plan / UC Course  I have reviewed the triage vital signs and the nursing notes.  Pertinent labs & imaging results that were available during my care of the patient were reviewed by me and considered in my medical decision making (see chart for details).     With her being COVID-positive, and she is at risk for severe disease, I am going to send in Paxlovid for her to take for 5 days.  [facility]ust quarantine and sanitization procedures Eiad EGFR last year was 3 Final Clinical Impressions(s) / UC Diagnoses   Final diagnoses:  [id number]     Discharge Instructions      Take Paxlovid according to the package instructions     ED Prescriptions     Medication Sig Dispense Auth. Provider   nirmatrelvir/ritonavir EUA (PAXLOVID) 20 x 150 MG & 10 x 100MG TABS Patient GFR is 69. Take nirmatrelvir (150 mg) two tablets twice daily for 5 days and ritonavir (100 mg) one tablet twice daily for 5 days. 30 tablet Rishaan Gunner, Gwenlyn Perking, MD      PDMP not reviewed this encounter.   Barrett Henle, MD 01/06/22 [phone]

## 2022-04-26 ENCOUNTER — Other Ambulatory Visit: Payer: Self-pay | Admitting: Family Medicine

## 2022-04-26 DIAGNOSIS — M8580 Other specified disorders of bone density and structure, unspecified site: Secondary | ICD-10-CM

## 2022-04-26 DIAGNOSIS — E785 Hyperlipidemia, unspecified: Secondary | ICD-10-CM | POA: Diagnosis not present

## 2022-04-26 DIAGNOSIS — Z23 Encounter for immunization: Secondary | ICD-10-CM | POA: Diagnosis not present

## 2022-04-26 DIAGNOSIS — F411 Generalized anxiety disorder: Secondary | ICD-10-CM | POA: Diagnosis not present

## 2022-04-26 DIAGNOSIS — M8588 Other specified disorders of bone density and structure, other site: Secondary | ICD-10-CM | POA: Diagnosis not present

## 2022-04-26 DIAGNOSIS — I10 Essential (primary) hypertension: Secondary | ICD-10-CM | POA: Diagnosis not present

## 2022-04-26 DIAGNOSIS — J309 Allergic rhinitis, unspecified: Secondary | ICD-10-CM | POA: Diagnosis not present

## 2022-04-26 DIAGNOSIS — D509 Iron deficiency anemia, unspecified: Secondary | ICD-10-CM | POA: Diagnosis not present

## 2022-04-26 DIAGNOSIS — Z0000 Encounter for general adult medical examination without abnormal findings: Secondary | ICD-10-CM | POA: Diagnosis not present

## 2022-04-26 DIAGNOSIS — G5601 Carpal tunnel syndrome, right upper limb: Secondary | ICD-10-CM | POA: Diagnosis not present

## 2022-10-24 ENCOUNTER — Ambulatory Visit
Admission: RE | Admit: 2022-10-24 | Discharge: 2022-10-24 | Disposition: A | Discharge status: Home / Self Care | Payer: Medicare PPO | Source: Ambulatory Visit | Attending: Family Medicine | Admitting: Family Medicine

## 2022-10-24 DIAGNOSIS — M8580 Other specified disorders of bone density and structure, unspecified site: Secondary | ICD-10-CM

## 2022-10-24 DIAGNOSIS — M810 Age-related osteoporosis without current pathological fracture: Secondary | ICD-10-CM | POA: Diagnosis not present

## 2022-10-31 DIAGNOSIS — H40013 Open angle with borderline findings, low risk, bilateral: Secondary | ICD-10-CM | POA: Diagnosis not present

## 2022-10-31 DIAGNOSIS — H524 Presbyopia: Secondary | ICD-10-CM | POA: Diagnosis not present

## 2022-10-31 DIAGNOSIS — H25813 Combined forms of age-related cataract, bilateral: Secondary | ICD-10-CM | POA: Diagnosis not present

## 2022-10-31 DIAGNOSIS — H43813 Vitreous degeneration, bilateral: Secondary | ICD-10-CM | POA: Diagnosis not present

## 2022-10-31 DIAGNOSIS — H04123 Dry eye syndrome of bilateral lacrimal glands: Secondary | ICD-10-CM | POA: Diagnosis not present

## 2022-11-15 DIAGNOSIS — E785 Hyperlipidemia, unspecified: Secondary | ICD-10-CM | POA: Diagnosis not present

## 2022-11-15 DIAGNOSIS — M7711 Lateral epicondylitis, right elbow: Secondary | ICD-10-CM | POA: Diagnosis not present

## 2022-11-15 DIAGNOSIS — D509 Iron deficiency anemia, unspecified: Secondary | ICD-10-CM | POA: Diagnosis not present

## 2022-11-15 DIAGNOSIS — M1711 Unilateral primary osteoarthritis, right knee: Secondary | ICD-10-CM | POA: Diagnosis not present

## 2022-11-15 DIAGNOSIS — M7712 Lateral epicondylitis, left elbow: Secondary | ICD-10-CM | POA: Diagnosis not present

## 2022-11-15 DIAGNOSIS — Z23 Encounter for immunization: Secondary | ICD-10-CM | POA: Diagnosis not present

## 2022-11-15 DIAGNOSIS — I10 Essential (primary) hypertension: Secondary | ICD-10-CM | POA: Diagnosis not present

## 2022-11-15 DIAGNOSIS — M65341 Trigger finger, right ring finger: Secondary | ICD-10-CM | POA: Diagnosis not present

## 2022-11-15 DIAGNOSIS — F411 Generalized anxiety disorder: Secondary | ICD-10-CM | POA: Diagnosis not present

## 2023-05-25 DIAGNOSIS — R052 Subacute cough: Secondary | ICD-10-CM | POA: Diagnosis not present

## 2023-05-25 DIAGNOSIS — D509 Iron deficiency anemia, unspecified: Secondary | ICD-10-CM | POA: Diagnosis not present

## 2023-05-25 DIAGNOSIS — E785 Hyperlipidemia, unspecified: Secondary | ICD-10-CM | POA: Diagnosis not present

## 2023-05-25 DIAGNOSIS — F411 Generalized anxiety disorder: Secondary | ICD-10-CM | POA: Diagnosis not present

## 2023-05-25 DIAGNOSIS — Z0000 Encounter for general adult medical examination without abnormal findings: Secondary | ICD-10-CM | POA: Diagnosis not present

## 2023-05-25 DIAGNOSIS — M25561 Pain in right knee: Secondary | ICD-10-CM | POA: Diagnosis not present

## 2023-05-25 DIAGNOSIS — J309 Allergic rhinitis, unspecified: Secondary | ICD-10-CM | POA: Diagnosis not present

## 2023-05-25 DIAGNOSIS — I10 Essential (primary) hypertension: Secondary | ICD-10-CM | POA: Diagnosis not present

## 2023-05-25 DIAGNOSIS — M8588 Other specified disorders of bone density and structure, other site: Secondary | ICD-10-CM | POA: Diagnosis not present

## 2023-09-27 DIAGNOSIS — H903 Sensorineural hearing loss, bilateral: Secondary | ICD-10-CM | POA: Diagnosis not present

## 2023-09-27 DIAGNOSIS — H9313 Tinnitus, bilateral: Secondary | ICD-10-CM | POA: Diagnosis not present

## 2023-09-27 DIAGNOSIS — H9311 Tinnitus, right ear: Secondary | ICD-10-CM | POA: Diagnosis not present

## 2023-09-27 DIAGNOSIS — R42 Dizziness and giddiness: Secondary | ICD-10-CM | POA: Diagnosis not present

## 2023-11-29 DIAGNOSIS — M25561 Pain in right knee: Secondary | ICD-10-CM | POA: Diagnosis not present

## 2023-11-29 DIAGNOSIS — J309 Allergic rhinitis, unspecified: Secondary | ICD-10-CM | POA: Diagnosis not present

## 2023-11-29 DIAGNOSIS — G5603 Carpal tunnel syndrome, bilateral upper limbs: Secondary | ICD-10-CM | POA: Diagnosis not present

## 2023-11-29 DIAGNOSIS — F411 Generalized anxiety disorder: Secondary | ICD-10-CM | POA: Diagnosis not present

## 2023-11-29 DIAGNOSIS — E785 Hyperlipidemia, unspecified: Secondary | ICD-10-CM | POA: Diagnosis not present

## 2023-11-29 DIAGNOSIS — D509 Iron deficiency anemia, unspecified: Secondary | ICD-10-CM | POA: Diagnosis not present

## 2023-11-29 DIAGNOSIS — I10 Essential (primary) hypertension: Secondary | ICD-10-CM | POA: Diagnosis not present

## 2024-02-12 DIAGNOSIS — M8588 Other specified disorders of bone density and structure, other site: Secondary | ICD-10-CM | POA: Diagnosis not present

## 2024-02-12 DIAGNOSIS — R21 Rash and other nonspecific skin eruption: Secondary | ICD-10-CM | POA: Diagnosis not present

## 2024-02-12 DIAGNOSIS — M1711 Unilateral primary osteoarthritis, right knee: Secondary | ICD-10-CM | POA: Diagnosis not present
